# Patient Record
Sex: Female | Born: 1985 | Race: Black or African American | Hispanic: No | Marital: Single | Smoking: Never smoker
Health system: Southern US, Community
[De-identification: ages and names within clinical notes are randomized; demographics above are authoritative.]

## PROBLEM LIST (undated history)

## (undated) DIAGNOSIS — R002 Palpitations: Secondary | ICD-10-CM

---

## 2019-12-24 ENCOUNTER — Encounter (HOSPITAL_COMMUNITY): Payer: Self-pay | Admitting: *Deleted

## 2019-12-24 ENCOUNTER — Other Ambulatory Visit: Payer: Self-pay

## 2019-12-24 ENCOUNTER — Emergency Department (HOSPITAL_COMMUNITY)
Admission: EM | Admit: 2019-12-24 | Discharge: 2019-12-25 | Disposition: A | Discharge status: Home / Self Care | Payer: Self-pay | Attending: Emergency Medicine | Admitting: Emergency Medicine

## 2019-12-24 DIAGNOSIS — M25551 Pain in right hip: Secondary | ICD-10-CM | POA: Insufficient documentation

## 2019-12-24 DIAGNOSIS — Z5321 Procedure and treatment not carried out due to patient leaving prior to being seen by health care provider: Secondary | ICD-10-CM | POA: Insufficient documentation

## 2019-12-24 LAB — I-STAT BETA HCG BLOOD, ED (MC, WL, AP ONLY): I-stat hCG, quantitative: <5 m[IU]/mL (ref <5)

## 2019-12-24 NOTE — ED Triage Notes (Signed)
Pt fell on Saturday night c/o pain in her rt hip since then  lmp 2 weeks ago

## 2019-12-25 ENCOUNTER — Emergency Department (HOSPITAL_COMMUNITY): Payer: Self-pay

## 2019-12-25 NOTE — ED Notes (Signed)
Pt called multiple times for VS recheck, no response.

## 2021-02-09 ENCOUNTER — Emergency Department (HOSPITAL_COMMUNITY)
Admission: EM | Admit: 2021-02-09 | Discharge: 2021-02-10 | Disposition: A | Discharge status: Home / Self Care | Payer: Self-pay | Attending: Emergency Medicine | Admitting: Emergency Medicine

## 2021-02-09 ENCOUNTER — Encounter (HOSPITAL_COMMUNITY): Payer: Self-pay | Admitting: Emergency Medicine

## 2021-02-09 ENCOUNTER — Emergency Department (HOSPITAL_COMMUNITY): Payer: Self-pay

## 2021-02-09 ENCOUNTER — Other Ambulatory Visit: Payer: Self-pay

## 2021-02-09 DIAGNOSIS — Z20822 Contact with and (suspected) exposure to covid-19: Secondary | ICD-10-CM | POA: Insufficient documentation

## 2021-02-09 DIAGNOSIS — M542 Cervicalgia: Secondary | ICD-10-CM | POA: Insufficient documentation

## 2021-02-09 DIAGNOSIS — R002 Palpitations: Secondary | ICD-10-CM | POA: Insufficient documentation

## 2021-02-09 DIAGNOSIS — R0602 Shortness of breath: Secondary | ICD-10-CM

## 2021-02-09 DIAGNOSIS — Z5321 Procedure and treatment not carried out due to patient leaving prior to being seen by health care provider: Secondary | ICD-10-CM | POA: Insufficient documentation

## 2021-02-09 LAB — CBC
HCT: 34.1 % — ABNORMAL LOW (ref 36.0–46.0)
Hemoglobin: 10.5 g/dL — ABNORMAL LOW (ref 12.0–15.0)
MCH: 23.5 pg — ABNORMAL LOW (ref 26.0–34.0)
MCHC: 30.8 g/dL (ref 30.0–36.0)
MCV: 76.3 fL — ABNORMAL LOW (ref 80.0–100.0)
Platelets: 243 10*3/uL (ref 150–400)
RBC: 4.47 MIL/uL (ref 3.87–5.11)
RDW: 15.9 % — ABNORMAL HIGH (ref 11.5–15.5)
WBC: 10.9 10*3/uL — ABNORMAL HIGH (ref 4.0–10.5)
nRBC: 0 % (ref 0.0–0.2)

## 2021-02-09 LAB — BASIC METABOLIC PANEL
Anion gap: 7 (ref 5–15)
BUN: 9 mg/dL (ref 6–20)
CO2: 25 mmol/L (ref 22–32)
Calcium: 9.2 mg/dL (ref 8.9–10.3)
Chloride: 103 mmol/L (ref 98–111)
Creatinine, Ser: 0.86 mg/dL (ref 0.44–1.00)
GFR, Estimated: >60 mL/min (ref >60)
Glucose, Bld: 87 mg/dL (ref 70–99)
Potassium: 3.5 mmol/L (ref 3.5–5.1)
Sodium: 135 mmol/L (ref 135–145)

## 2021-02-09 LAB — RESP PANEL BY RT-PCR (FLU A&B, COVID) ARPGX2
Influenza A by PCR: NEGATIVE
Influenza B by PCR: NEGATIVE
SARS Coronavirus 2 by RT PCR: NEGATIVE

## 2021-02-09 LAB — TROPONIN I (HIGH SENSITIVITY): Troponin I (High Sensitivity): 5 ng/L (ref <18)

## 2021-02-09 NOTE — ED Triage Notes (Signed)
Pt here from home via [facility] for palpitations. Pt had 2 episodes after 7pm and another w/ EMS. Pt deneis any pain, dizziness, shob. States she gets a tightness in her neck when it happens. Per EMS pt was resting and HR increased to 140s.

## 2021-02-09 NOTE — ED Provider Notes (Signed)
Emergency Medicine Provider Triage Evaluation Note  Paula Craig , a 35 y.o. female  was evaluated in triage.  Pt complains of palpitations that have occurred intermittently today. She further c/o rhinorrhea, congestion, cough and subjective fevers. Her daughters have been ill with influenza at home.  Review of Systems  Positive: Uri sxs, palpitations Negative: Chest pain  Physical Exam  BP 122/72   Pulse 90   Temp 99.8 F (37.7 C) (Oral)   Resp 18   SpO2 100%  Gen:   Awake, no distress   Resp:  Normal effort  MSK:   Moves extremities without difficulty  Other:  Heart with rrr, lungs ctab  Medical Decision Making  Medically screening exam initiated at 9:45 PM.  Appropriate orders placed.  Paula Craig was informed that the remainder of the evaluation will be completed by another provider, this initial triage assessment does not replace that evaluation, and the importance of remaining in the ED until their evaluation is complete.     Paula Craig 02/09/21 2146    Lorre Nick, MD 02/10/21 [phone]

## 2021-02-10 LAB — TROPONIN I (HIGH SENSITIVITY): Troponin I (High Sensitivity): 5 ng/L (ref <18)

## 2021-02-10 NOTE — ED Notes (Signed)
This patient did not respond for updated vitals.

## 2021-02-10 NOTE — ED Notes (Signed)
Pt not responding  to be roomed  

## 2021-02-12 ENCOUNTER — Encounter (HOSPITAL_COMMUNITY): Payer: Self-pay | Admitting: Emergency Medicine

## 2021-02-12 ENCOUNTER — Other Ambulatory Visit: Payer: Self-pay

## 2021-02-12 ENCOUNTER — Emergency Department (HOSPITAL_COMMUNITY)
Admission: EM | Admit: 2021-02-12 | Discharge: 2021-02-13 | Disposition: A | Discharge status: Home / Self Care | Payer: Self-pay | Attending: Emergency Medicine | Admitting: Emergency Medicine

## 2021-02-12 DIAGNOSIS — R002 Palpitations: Secondary | ICD-10-CM | POA: Insufficient documentation

## 2021-02-12 DIAGNOSIS — J00 Acute nasopharyngitis [common cold]: Secondary | ICD-10-CM | POA: Insufficient documentation

## 2021-02-12 LAB — BASIC METABOLIC PANEL
Anion gap: 7 (ref 5–15)
BUN: 7 mg/dL (ref 6–20)
CO2: 25 mmol/L (ref 22–32)
Calcium: 9.1 mg/dL (ref 8.9–10.3)
Chloride: 104 mmol/L (ref 98–111)
Creatinine, Ser: 0.84 mg/dL (ref 0.44–1.00)
GFR, Estimated: >60 mL/min (ref >60)
Glucose, Bld: 110 mg/dL — ABNORMAL HIGH (ref 70–99)
Potassium: 3.6 mmol/L (ref 3.5–5.1)
Sodium: 136 mmol/L (ref 135–145)

## 2021-02-12 LAB — TSH: TSH: 1.169 u[IU]/mL (ref 0.350–4.500)

## 2021-02-12 LAB — I-STAT BETA HCG BLOOD, ED (MC, WL, AP ONLY): I-stat hCG, quantitative: <5 m[IU]/mL (ref <5)

## 2021-02-12 LAB — CBC
HCT: 37.6 % (ref 36.0–46.0)
Hemoglobin: 11.5 g/dL — ABNORMAL LOW (ref 12.0–15.0)
MCH: 23.5 pg — ABNORMAL LOW (ref 26.0–34.0)
MCHC: 30.6 g/dL (ref 30.0–36.0)
MCV: 76.7 fL — ABNORMAL LOW (ref 80.0–100.0)
Platelets: 281 10*3/uL (ref 150–400)
RBC: 4.9 MIL/uL (ref 3.87–5.11)
RDW: 15.9 % — ABNORMAL HIGH (ref 11.5–15.5)
WBC: 8.5 10*3/uL (ref 4.0–10.5)
nRBC: 0 % (ref 0.0–0.2)

## 2021-02-12 LAB — MAGNESIUM: Magnesium: 2.1 mg/dL (ref 1.7–2.4)

## 2021-02-12 LAB — PHOSPHORUS: Phosphorus: 2.2 mg/dL — ABNORMAL LOW (ref 2.5–4.6)

## 2021-02-12 NOTE — ED Triage Notes (Signed)
Pt reports palpitations intermittently since 11/30.  Seen in ED on 11/30 and left due to wait.  Denies chest pain and SOB.  States she has episodes of feeling hot just prior to palpitations.

## 2021-02-12 NOTE — ED Provider Notes (Signed)
Emergency Medicine Provider Triage Evaluation Note  Paula Craig , a 35 y.o. female  was evaluated in triage.  Pt complains of palpitations over the past week.  She says she was seen on 11/30 and was brought here by EMS where they did document an elevated heart rate during episode.  However when she got here, her EKG was normal.  She left without being seen by provider.  She presents today because she is had continued palpitations.  She is not currently having symptoms now..  Review of Systems  Positive:  Negative:   Physical Exam  BP 121/65 (BP Location: Left Arm)   Pulse 86   Temp 99.1 F (37.3 C) (Oral)   Resp 17   LMP 01/18/2021   SpO2 100%  Gen:   Awake, no distress   Resp:  Normal effort  MSK:   Moves extremities without difficulty  Other:    Medical Decision Making  Medically screening exam initiated at 6:29 PM.  Appropriate orders placed.  Paula Craig was informed that the remainder of the evaluation will be completed by another provider, this initial triage assessment does not replace that evaluation, and the importance of remaining in the ED until their evaluation is complete.     Paula Craig 02/12/21 1830    Gerhard Munch, MD 02/12/21 [place]

## 2021-02-13 NOTE — ED Provider Notes (Signed)
[facility] EMERGENCY DEPARTMENT Provider Note  CSN: [id number] Arrival date & time: 02/12/21 1703  Chief Complaint(s) Palpitations  HPI Paula Craig is a 35 y.o. female    Palpitations Palpitations quality:  Regular Onset quality:  Sudden Duration: a few seconds at a time. started 3 days ago. has had 2-3 palpitations per day. Timing:  Intermittent Progression:  Waxing and waning Chronicity:  New Context: not appetite suppressants, not blood loss, not caffeine, not dehydration, not exercise, not hyperventilation, not illicit drugs, not nicotine and not stimulant use   Context comment:  Currently had URI. Kids at home have been sick with similar URI sxs Relieved by: self resolving. Worsened by:  Nothing Associated symptoms: cough   Associated symptoms: no chest pain, no chest pressure, no diaphoresis, no dizziness, no leg pain, no lower extremity edema, no malaise/fatigue, no near-syncope, no shortness of breath and no vomiting   Risk factors: no hx of thyroid disease and no OTC sinus medications    Patient come in on the initial day, November 30, and had reassuring work-up with no electrolyte derangements, renal insufficiency.  EKG without acute ischemic changes, dysrhythmias or blocks.  Serial troponins were negative.  COVID/influenza was negative.  Returns today given recurrent symptoms  Past Medical History History reviewed. No pertinent past medical history. There are no problems to display for this patient.  Home Medication(s) Prior to Admission medications   Not on File                                                                                                                                    Past Surgical History Past Surgical History:  Procedure Laterality Date   CESAREAN SECTION     Family History No family history on file.  Social History Social History   Tobacco Use   Smoking status: Never   Smokeless tobacco: Never   Substance Use Topics   Alcohol use: Yes   Drug use: Not Currently   Allergies Patient has no known allergies.  Review of Systems Review of Systems  Constitutional:  Negative for diaphoresis and malaise/fatigue.  Respiratory:  Positive for cough. Negative for shortness of breath.   Cardiovascular:  Positive for palpitations. Negative for chest pain and near-syncope.  Gastrointestinal:  Negative for vomiting.  Neurological:  Negative for dizziness.  All other systems are reviewed and are negative for acute change except as noted in the HPI  Physical Exam Vital Signs  I have reviewed the triage vital signs BP 117/81 (BP Location: Left Arm)   Pulse 75   Temp 99.1 F (37.3 C) (Oral)   Resp 15   Ht 5\' 1"  (1.549 m)   Wt 79 kg   LMP 01/18/2021   SpO2 100%   BMI 32.91 kg/m   Physical Exam Vitals reviewed.  Constitutional:      General: She is not in acute distress.    Appearance: She  is well-developed. She is not diaphoretic.  HENT:     Head: Normocephalic and atraumatic.     Right Ear: Tympanic membrane normal.     Left Ear: Tympanic membrane normal.     Nose: Nose normal.     Mouth/Throat:     Mouth: No oral lesions.     Tongue: No lesions.     Palate: No lesions.     Pharynx: No oropharyngeal exudate or posterior oropharyngeal erythema.     Tonsils: No tonsillar exudate or tonsillar abscesses.     Comments: Post nasal drip Eyes:     General: No scleral icterus.       Right eye: No discharge.        Left eye: No discharge.     Conjunctiva/sclera: Conjunctivae normal.     Pupils: Pupils are equal, round, and reactive to light.  Cardiovascular:     Rate and Rhythm: Normal rate and regular rhythm.     Heart sounds: No murmur heard.   No friction rub. No gallop.  Pulmonary:     Effort: Pulmonary effort is normal. No respiratory distress.     Breath sounds: Normal breath sounds. No stridor. No rales.  Abdominal:     General: There is no distension.     Palpations:  Abdomen is soft.     Tenderness: There is no abdominal tenderness.  Musculoskeletal:        General: No tenderness.     Cervical back: Normal range of motion and neck supple.  Skin:    General: Skin is warm and dry.     Findings: No erythema or rash.  Neurological:     Mental Status: She is alert and oriented to person, place, and time.    ED Results and Treatments Labs (all labs ordered are listed, but only abnormal results are displayed) Labs Reviewed  BASIC METABOLIC PANEL - Abnormal; Notable for the following components:      Result Value   Glucose, Bld 110 (*)    All other components within normal limits  CBC - Abnormal; Notable for the following components:   Hemoglobin 11.5 (*)    MCV 76.7 (*)    MCH 23.5 (*)    RDW 15.9 (*)    All other components within normal limits  PHOSPHORUS - Abnormal; Notable for the following components:   Phosphorus 2.2 (*)    All other components within normal limits  TSH  MAGNESIUM  I-STAT BETA HCG BLOOD, ED ([facility], [facility], [facility] ONLY)                                                                                                                         EKG  EKG Interpretation Vent. rate 90 BPM PR interval 132 ms QRS duration 72 ms QT/QTcB 362/442 ms P-R-T axes 61 44 33 Normal sinus rhythm Normal ECG No acute changes Confirmed by Drema Pry [phone]) on 02/13/2021 4:09:58 AM      Radiology No results  found.  Pertinent labs & imaging results that were available during my care of the patient were reviewed by me and considered in my medical decision making (see MDM for details).  Medications Ordered in ED Medications - No data to display                                                                                                                                   Procedures Procedures  (including critical care time)  Medical Decision Making / ED Course I have reviewed the nursing notes for this encounter and the patient's prior  records (if available in EHR or on provided paperwork).  Paula Craig was evaluated in Emergency Department on 02/13/2021 for the symptoms described in the history of present illness. She was evaluated in the context of the global COVID-19 pandemic, which necessitated consideration that the patient might be at risk for infection with the SARS-CoV-2 virus that causes COVID-19. Institutional protocols and algorithms that pertain to the evaluation of patients at risk for COVID-19 are in a state of rapid change based on information released by regulatory bodies including the CDC and federal and state organizations. These policies and algorithms were followed during the patient's care in the ED.     Intermittent palpitations in the setting of URI symptoms. No associated chest pain or shortness of breath. Previous work-up on November 30 was reassuring -results noted above in HPI. Patient seen today in the [facility] process and screening labs obtain which were again grossly reassuring without significant changes.  TSH within normal limits. EKG and telemetry today without dysrhythmias or blocks.  Pertinent labs & imaging results that were available during my care of the patient were reviewed by me and considered in my medical decision making:    Final Clinical Impression(s) / ED Diagnoses Final diagnoses:  Acute nasopharyngitis  Palpitations   The patient appears reasonably screened and/or stabilized for discharge and I doubt any other medical condition or other [facility] requiring further screening, evaluation, or treatment in the ED at this time prior to discharge. Safe for discharge with strict return precautions.  Disposition: Discharge  Condition: Good  I have discussed the results, Dx and Tx plan with the patient/family who expressed understanding and agree(s) with the plan. Discharge instructions discussed at length. The patient/family was given strict return precautions who verbalized understanding  of the instructions. No further questions at time of discharge.    ED Discharge Orders     None       Follow Up: Primary care provider  Call  if you do not have a primary care physician, contact [facility] at [phone] for referral    This chart was dictated using voice recognition software.  Despite best efforts to proofread,  errors can occur which can change the documentation meaning.    Nira Conn, MD 02/13/21 [phone]

## 2021-02-23 IMAGING — CR DG HIP (WITH OR WITHOUT PELVIS) 2-3V*R*
3 series · 3 of 3 positions shown · non-contrast
Comparison: None.

CLINICAL DATA: Fall, right hip pain

EXAM:
DG HIP (WITH OR WITHOUT PELVIS) 2-3V RIGHT

[pelvis ap]
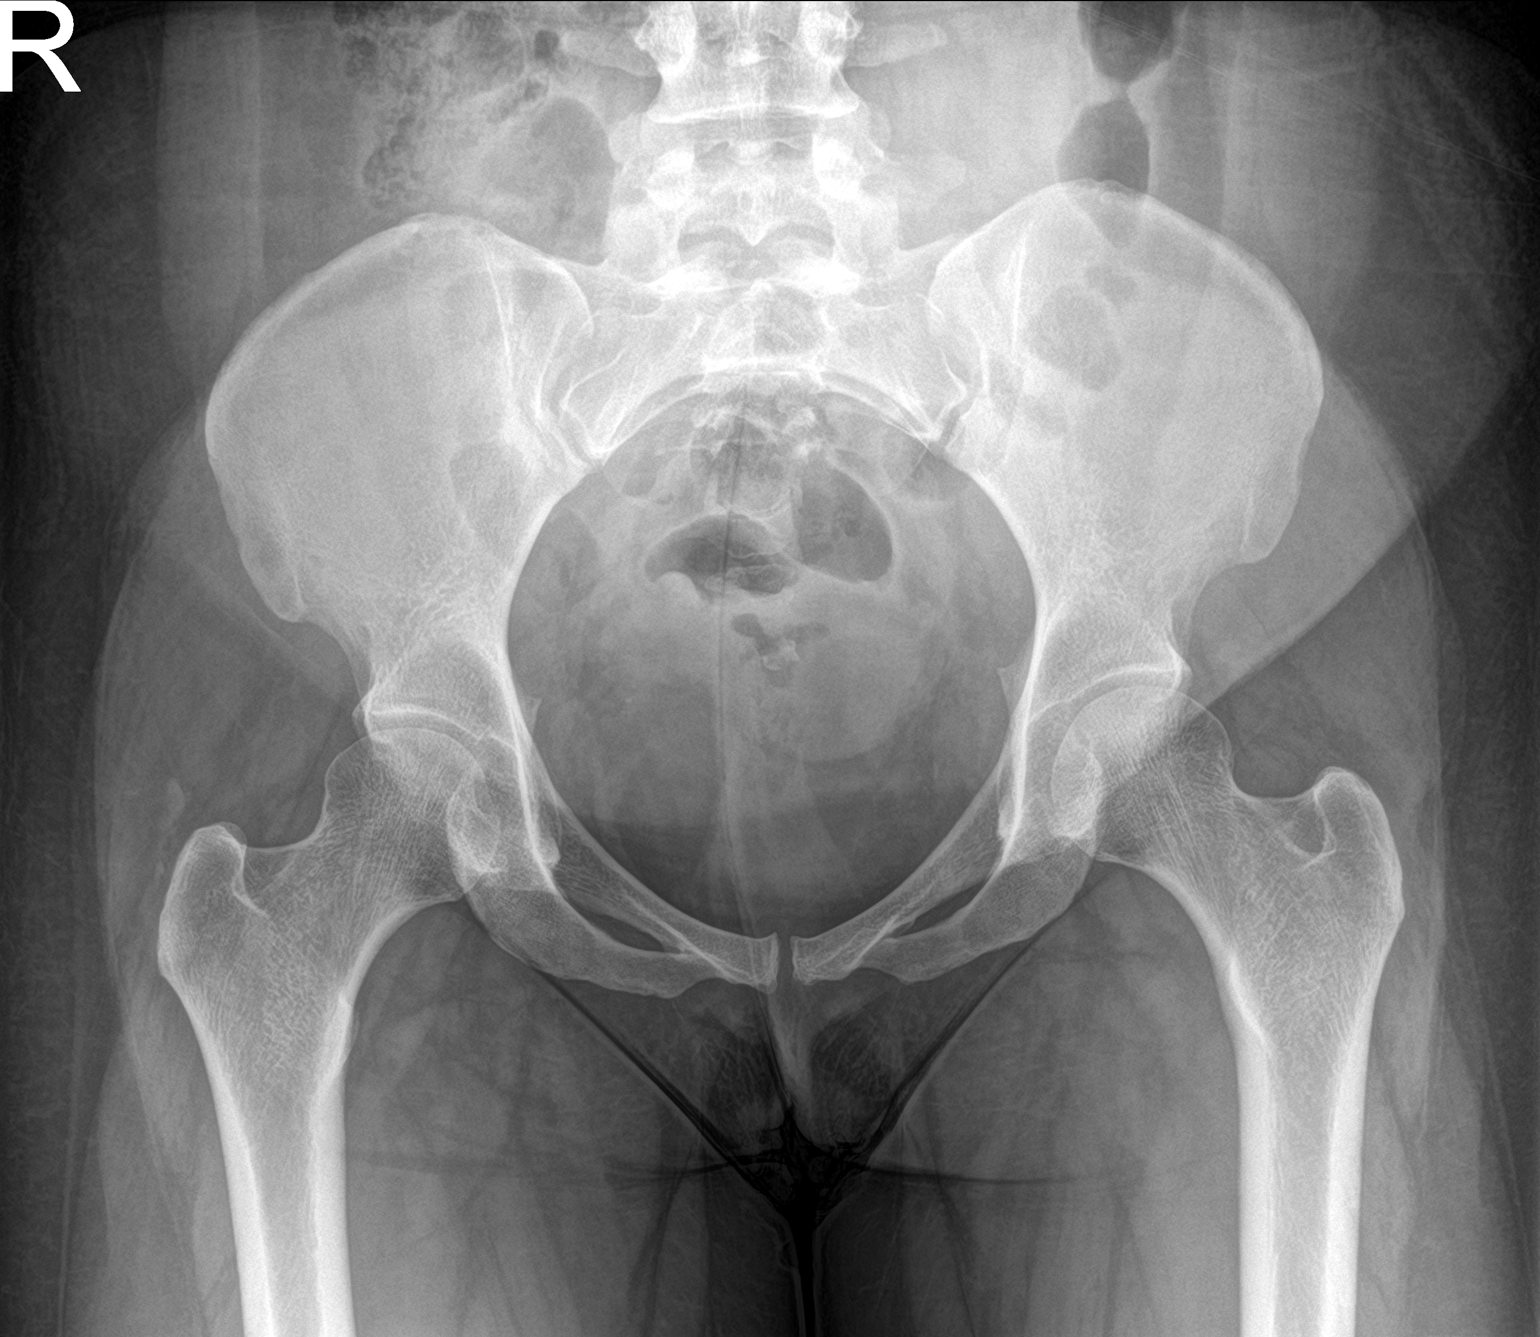

[hip ap]
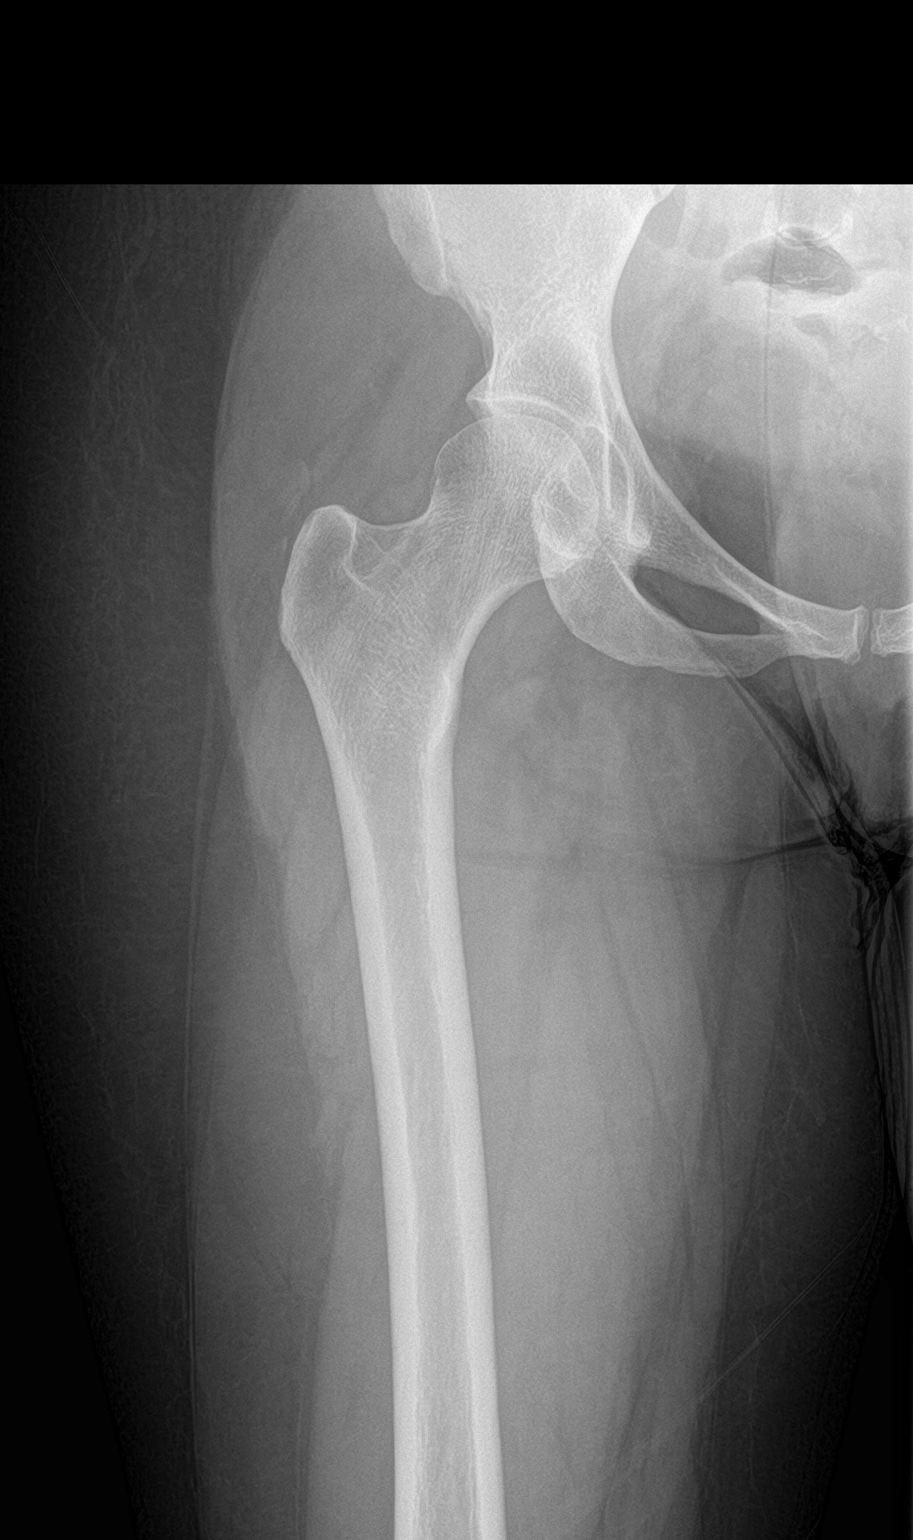

[hip lat]
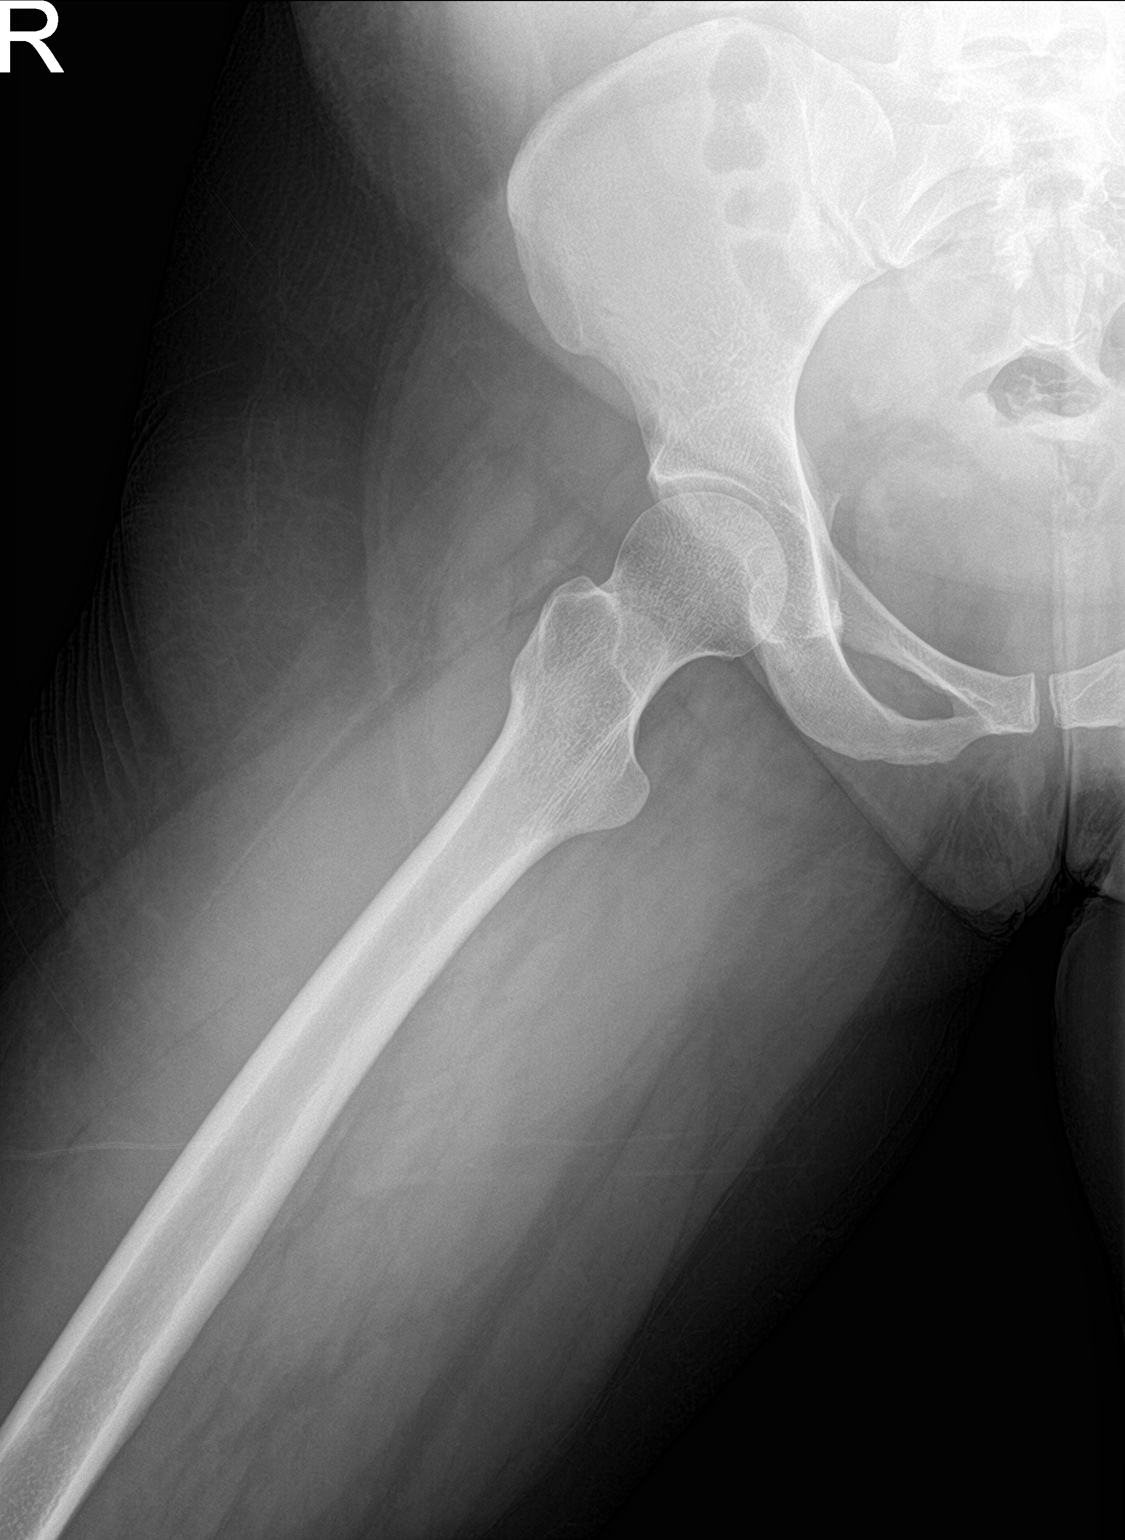

[3 of 3 positions shown; findings below may reference images not displayed]

FINDINGS: There is no evidence of hip fracture or dislocation. There is no
evidence of arthropathy or other focal bone abnormality.
IMPRESSION: Negative.

## 2021-05-04 ENCOUNTER — Emergency Department (HOSPITAL_BASED_OUTPATIENT_CLINIC_OR_DEPARTMENT_OTHER)
Admission: EM | Admit: 2021-05-04 | Discharge: 2021-05-04 | Disposition: A | Discharge status: Home / Self Care | Payer: Self-pay | Attending: Emergency Medicine | Admitting: Emergency Medicine

## 2021-05-04 ENCOUNTER — Other Ambulatory Visit: Payer: Self-pay

## 2021-05-04 ENCOUNTER — Encounter (HOSPITAL_BASED_OUTPATIENT_CLINIC_OR_DEPARTMENT_OTHER): Payer: Self-pay

## 2021-05-04 DIAGNOSIS — R002 Palpitations: Secondary | ICD-10-CM | POA: Insufficient documentation

## 2021-05-04 HISTORY — DX: Palpitations: R00.2

## 2021-05-04 NOTE — ED Triage Notes (Signed)
Palpitations off and on since November. Episode this am lasting seconds. Only taking metoprolol when episodes occur

## 2021-05-04 NOTE — ED Notes (Signed)
ED Provider at bedside. 

## 2021-05-04 NOTE — ED Provider Notes (Signed)
[facility] EMERGENCY DEPARTMENT Provider Note   CSN: [id number] Arrival date & time: 05/04/21  1057     History  Chief Complaint  Patient presents with   Palpitations    Paula Craig is a 36 y.o. female presenting today with complaint of palpitations.  She has been seen for this 5 times in the past and recently saw cardiology on Friday.  They evaluated her and in March plan to give her a portable monitor.  She says that this morning she was sitting down and felt her heart start to race.  She does not measure her exact rate but she can feel it.  It decreased on its own.  Denies caffeine, chocolate, alcohol, drug use, and reports that she only drinks water occasionally with garlic because she prefers natural remedies.  She was prescribed metoprolol on Friday by cardiology but reports that she only has taking it once on Saturday when this episode started.  She was also prescribed magnesium which she took this morning.  She says that she does not want to be hooked on medications so she has only been taking these medications when she feels the palpitations.  No history of anxiety.  No other cardiac history, diabetes, elevated cholesterol, heart disease in her family.   Palpitations Associated symptoms: no back pain, no chest pain, no dizziness, no nausea, no shortness of breath and no vomiting       Home Medications Prior to Admission medications   Medication Sig Start Date End Date Taking? Authorizing Provider  metoprolol succinate (TOPROL-XL) 25 MG 24 hr tablet Take 25 mg by mouth daily. 04/29/21   [provider]      Allergies    Penicillins    Review of Systems   Review of Systems  Respiratory:  Negative for shortness of breath.   Cardiovascular:  Positive for palpitations. Negative for chest pain.  Gastrointestinal:  Negative for nausea and vomiting.  Musculoskeletal:  Negative for back pain.  Neurological:  Negative for dizziness.  See HPI for  more Physical Exam Updated Vital Signs BP 130/66    Pulse 88    Temp 98.8 F (37.1 C)    Resp 14    Ht 5\' 1"  (1.549 m)    Wt 78 kg    LMP 03/19/2021    SpO2 100%    BMI 32.50 kg/m  Physical Exam Vitals and nursing note reviewed.  Constitutional:      General: She is not in acute distress.    Appearance: Normal appearance.  HENT:     Head: Normocephalic and atraumatic.  Eyes:     General: No scleral icterus.    Conjunctiva/sclera: Conjunctivae normal.  Cardiovascular:     Rate and Rhythm: Normal rate and regular rhythm.  Pulmonary:     Effort: Pulmonary effort is normal. No respiratory distress.     Breath sounds: No wheezing or rales.  Skin:    General: Skin is warm and dry.     Findings: No rash.  Neurological:     Mental Status: She is alert.  Psychiatric:        Mood and Affect: Mood normal.    ED Results / Procedures / Treatments   Labs (all labs ordered are listed, but only abnormal results are displayed) Labs Reviewed - No data to display  EKG EKG Interpretation  Date/Time:  Wednesday May 04 2021 11:06:59 EST Ventricular Rate:  98 PR Interval:  135 QRS Duration: 75 QT Interval:  346 QTC  Calculation: 442 R Axis:   45 Text Interpretation: Sinus rhythm Confirmed by Virgina Norfolk ([id number]) on 05/04/2021 11:35:23 AM  Radiology No results found.  Procedures Procedures    Medications Ordered in ED Medications - No data to display  ED Course/ Medical Decision Making/ A&P                           Medical Decision Making  36 year old female presenting with palpitations.  This has been on and off for the past year.  Has never seen cardiologist until Friday where they prescribed her magnesium and metoprolol to take daily.  She does not want to take metoprolol every day but says it helped bring her heart rate down on Saturday when she took it during an episode.  Differential includes but is not limited to ACS, cardiac arrhythmia, substance induced  tachycardia, hyperthyroidism and anemia.  Work-up: EKG in normal sinus with a normal rate.  Confirmed by Dr. Lockie Mola with whom I discussed this case.   Disposition/MDM: Per chart review, she has had many visits for this over the year.  CBC, magnesium and TSH were normal 5 days ago.  Also had a negative pregnancy test.  At this time, I do not believe we need to work-up this problem again when she is already following with cardiology.  She is concerned that she will not have a heart monitor until the end of March.  I will refer her to her primary care provider and they may potentially help her obtain an ambulatory monitor.  We discussed that it is also a good idea for her to have a primary care provider to evaluate her overall health.  She is agreeable to this.  I also counseled the patient about the importance of taking the metoprolol as cardiology recommended.  I told her that this is not an addictive medication and that it would likely help her more than her garlic water.  She says that she will try to take it every day and continue to take her magnesium. Final Clinical Impression(s) / ED Diagnoses Final diagnoses:  Palpitations    Rx / DC Orders Results and diagnoses were explained to the patient. Return precautions discussed in full. Patient had no additional questions and expressed complete understanding.   This chart was dictated using voice recognition software.  Despite best efforts to proofread,  errors can occur which can change the documentation meaning.    Saddie Benders, PA-C 05/04/21 1156    Virgina Norfolk, DO 05/04/21 1202

## 2021-05-04 NOTE — Discharge Instructions (Addendum)
You may schedule an appointment with either primary care office attached to these discharge papers.  They may be able to further evaluate you until you are seen by cardiology.  Please take the medications the cardiologist prescribed.  Return with any chest pain, dizziness, passing out, difficulty breathing or other worsening of your symptoms

## 2021-05-19 ENCOUNTER — Ambulatory Visit (INDEPENDENT_AMBULATORY_CARE_PROVIDER_SITE_OTHER): Payer: Self-pay | Admitting: Physician Assistant

## 2021-05-19 ENCOUNTER — Encounter: Payer: Self-pay | Admitting: Physician Assistant

## 2021-05-19 VITALS — BP 117/74 | HR 81 | Temp 98.0°F | Ht 61.0 in | Wt 169.2 lb

## 2021-05-19 DIAGNOSIS — F410 Panic disorder [episodic paroxysmal anxiety] without agoraphobia: Secondary | ICD-10-CM

## 2021-05-19 DIAGNOSIS — D508 Other iron deficiency anemias: Secondary | ICD-10-CM

## 2021-05-19 DIAGNOSIS — F411 Generalized anxiety disorder: Secondary | ICD-10-CM

## 2021-05-19 NOTE — Patient Instructions (Addendum)
So good to meet you today!  ?Please see attached handouts for reference on how to increase iron in your diet and to naturally help anxiousness. ? ?I think counseling will help a lot. ?Keep working out and getting outside daily. ? ?Call if any concerns! See you back soon.  ?

## 2021-05-19 NOTE — Progress Notes (Signed)
? ?Subjective:  ? ? Patient ID: Paula Craig, female    DOB: 1985-07-26, 36 y.o.   MRN: [id number] ? ?Chief Complaint  ?Patient presents with  ? Depression  ? Anxiety  ? Sinusitis  ? ? ?HPI ?36 y.o. patient presents today for new patient establishment with me.  Patient has not had PCP in awhile.  ? ?Current Care Team: ?Dr. Lester Kinsman - Cardiology  ? ?Acute Concerns: ?Heart racing, palpitations, several ED visits over the last few months. Went to see cardiology on 04/29/2021. She is supposed to have a heart monitor on 06/02/21. Some occasional 2/10 pain in center of chest.  ? ?Cut out alcohol and caffeine. Exercising more. Has been feeling extra tired and has been drinking a lot of water. Changed her work schedule only to three days per week.  ? ?This morning felt a panic attack when she looked at juice ingredients and saw it had caffeine. She was worried about what it was going to do to her.  ? ?Pt states that she is finally ready to admit to herself this is all anxiety related. When asking about any stress or triggers, she notes that she moved from the [place] two years ago; she is no longer living near mom and sister who are a big support to her; and she had a falling out with close cousins. ? ?She is scheduled to start therapy soon. No SI or HI.  ? ? ?Past Medical History:  ?Diagnosis Date  ? Palpitations   ? ? ?Past Surgical History:  ?Procedure Laterality Date  ? CESAREAN SECTION    ? CESAREAN SECTION    ? ? ?History reviewed. No pertinent family history. ? ?Social History  ? ?Tobacco Use  ? Smoking status: Never  ? Smokeless tobacco: Never  ?Vaping Use  ? Vaping Use: Never used  ?Substance Use Topics  ? Alcohol use: Yes  ? Drug use: Not Currently  ?  ? ?Allergies  ?Allergen Reactions  ? Penicillins   ? ? ?Review of Systems ?NEGATIVE UNLESS OTHERWISE INDICATED IN HPI ? ? ?   ?Objective:  ?  ? ?BP 117/74   Pulse 81   Temp 98 ?F (36.7 ?C)   Ht 5\' 1"  (1.549 m)   Wt 169 lb 3.2 oz (76.7 kg)   LMP 05/06/2021    SpO2 98%   BMI 31.97 kg/m?  ? ?Wt Readings from Last 3 Encounters:  ?05/19/21 169 lb 3.2 oz (76.7 kg)  ?05/04/21 172 lb (78 kg)  ?02/13/21 174 lb 2.6 oz (79 kg)  ? ? ?BP Readings from Last 3 Encounters:  ?05/19/21 117/74  ?05/04/21 111/71  ?02/13/21 115/85  ?  ? ?Physical Exam ?Vitals and nursing note reviewed.  ?Constitutional:   ?   Appearance: Normal appearance. She is normal weight. She is not toxic-appearing.  ?HENT:  ?   Head: Normocephalic and atraumatic.  ?   Right Ear: Tympanic membrane, ear canal and external ear normal.  ?   Left Ear: Tympanic membrane, ear canal and external ear normal.  ?   Nose: Nose normal.  ?   Mouth/Throat:  ?   Mouth: Mucous membranes are moist.  ?Eyes:  ?   Extraocular Movements: Extraocular movements intact.  ?   Conjunctiva/sclera: Conjunctivae normal.  ?   Pupils: Pupils are equal, round, and reactive to light.  ?Cardiovascular:  ?   Rate and Rhythm: Normal rate and regular rhythm.  ?   Pulses: Normal pulses.  ?   Heart sounds:  Normal heart sounds.  ?Pulmonary:  ?   Effort: Pulmonary effort is normal.  ?   Breath sounds: Normal breath sounds.  ?Abdominal:  ?   General: Abdomen is flat. Bowel sounds are normal.  ?   Palpations: Abdomen is soft.  ?Musculoskeletal:     ?   General: Normal range of motion.  ?   Cervical back: Normal range of motion and neck supple.  ?Skin: ?   General: Skin is warm and dry.  ?Neurological:  ?   General: No focal deficit present.  ?   Mental Status: She is alert and oriented to person, place, and time.  ?Psychiatric:     ?   Mood and Affect: Mood is anxious. Affect is tearful.  ? ? ?   ?Assessment & Plan:  ? ?Problem List Items Addressed This Visit   ?None ?Visit Diagnoses   ? ? Generalized anxiety disorder with panic attacks    -  Primary  ? Other iron deficiency anemia      ? ?  ? ?1. Generalized anxiety disorder with panic attacks ?Patient declines medication at this time.  She states that she is starting to do better as she has been working on  exercise and trying to have healthier diet by cutting out sugar.  She is trying to get outside more.  She is scheduled to start with a counselor soon and I think this will help her a lot. ? ?I still would like to have close follow-up with her in the next few months to make sure that she is doing okay.  Patient knows to call with any concerns. ? ?2. Other iron deficiency anemia ?As noted iron deficiency anemia on recent labs. Most likely 2/2 menstruation. She is going to increase iron in her diet. An After Visit Summary was printed and given to the patient for guidance on this as well. ? ? ? ?This note was prepared with assistance of Conservation officer, historic buildings. Occasional wrong-word or sound-a-like substitutions may have occurred due to the inherent limitations of voice recognition software. ? ?Time Spent: ?36 minutes of total time was spent on the date of the encounter performing the following actions: chart review prior to seeing the patient, obtaining history, performing a medically necessary exam, counseling on the treatment plan, placing orders, and documenting in our EHR.   ? ?Debi Cousin M Shatyra Becka, PA-C ?

## 2021-05-31 ENCOUNTER — Ambulatory Visit: Payer: Self-pay | Admitting: Physician Assistant

## 2021-12-05 ENCOUNTER — Encounter: Payer: Self-pay | Admitting: *Deleted

## 2022-02-23 ENCOUNTER — Encounter: Payer: Self-pay | Admitting: *Deleted

## 2022-09-04 ENCOUNTER — Ambulatory Visit: Payer: Self-pay | Admitting: Physician Assistant

## 2023-12-24 ENCOUNTER — Ambulatory Visit: Payer: Self-pay | Admitting: Physician Assistant
